# Patient Record
Sex: Female | Born: 1995 | Race: Black or African American | Hispanic: No | Marital: Single | Smoking: Current every day smoker
Health system: Southern US, Community
[De-identification: ages and names within clinical notes are randomized; demographics above are authoritative.]

## PROBLEM LIST (undated history)

## (undated) DIAGNOSIS — E119 Type 2 diabetes mellitus without complications: Secondary | ICD-10-CM

## (undated) DIAGNOSIS — I10 Essential (primary) hypertension: Secondary | ICD-10-CM

## (undated) DIAGNOSIS — L309 Dermatitis, unspecified: Secondary | ICD-10-CM

## (undated) DIAGNOSIS — E669 Obesity, unspecified: Secondary | ICD-10-CM

## (undated) HISTORY — DX: Obesity, unspecified: E66.9

## (undated) HISTORY — DX: Dermatitis, unspecified: L30.9

## (undated) HISTORY — DX: Type 2 diabetes mellitus without complications: E11.9

---
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
## 2017-03-15 IMAGING — CR DG CHEST 2V
2 series · 2 of 2 positions shown · non-contrast
Comparison: None.

CLINICAL DATA: Chest pain. Severe anxiety. Hyperventilated.
Left-sided chest pain. History of hypertension and diabetes.
Nonsmoker.

EXAM:
CHEST  2 VIEW

[w chest lat]
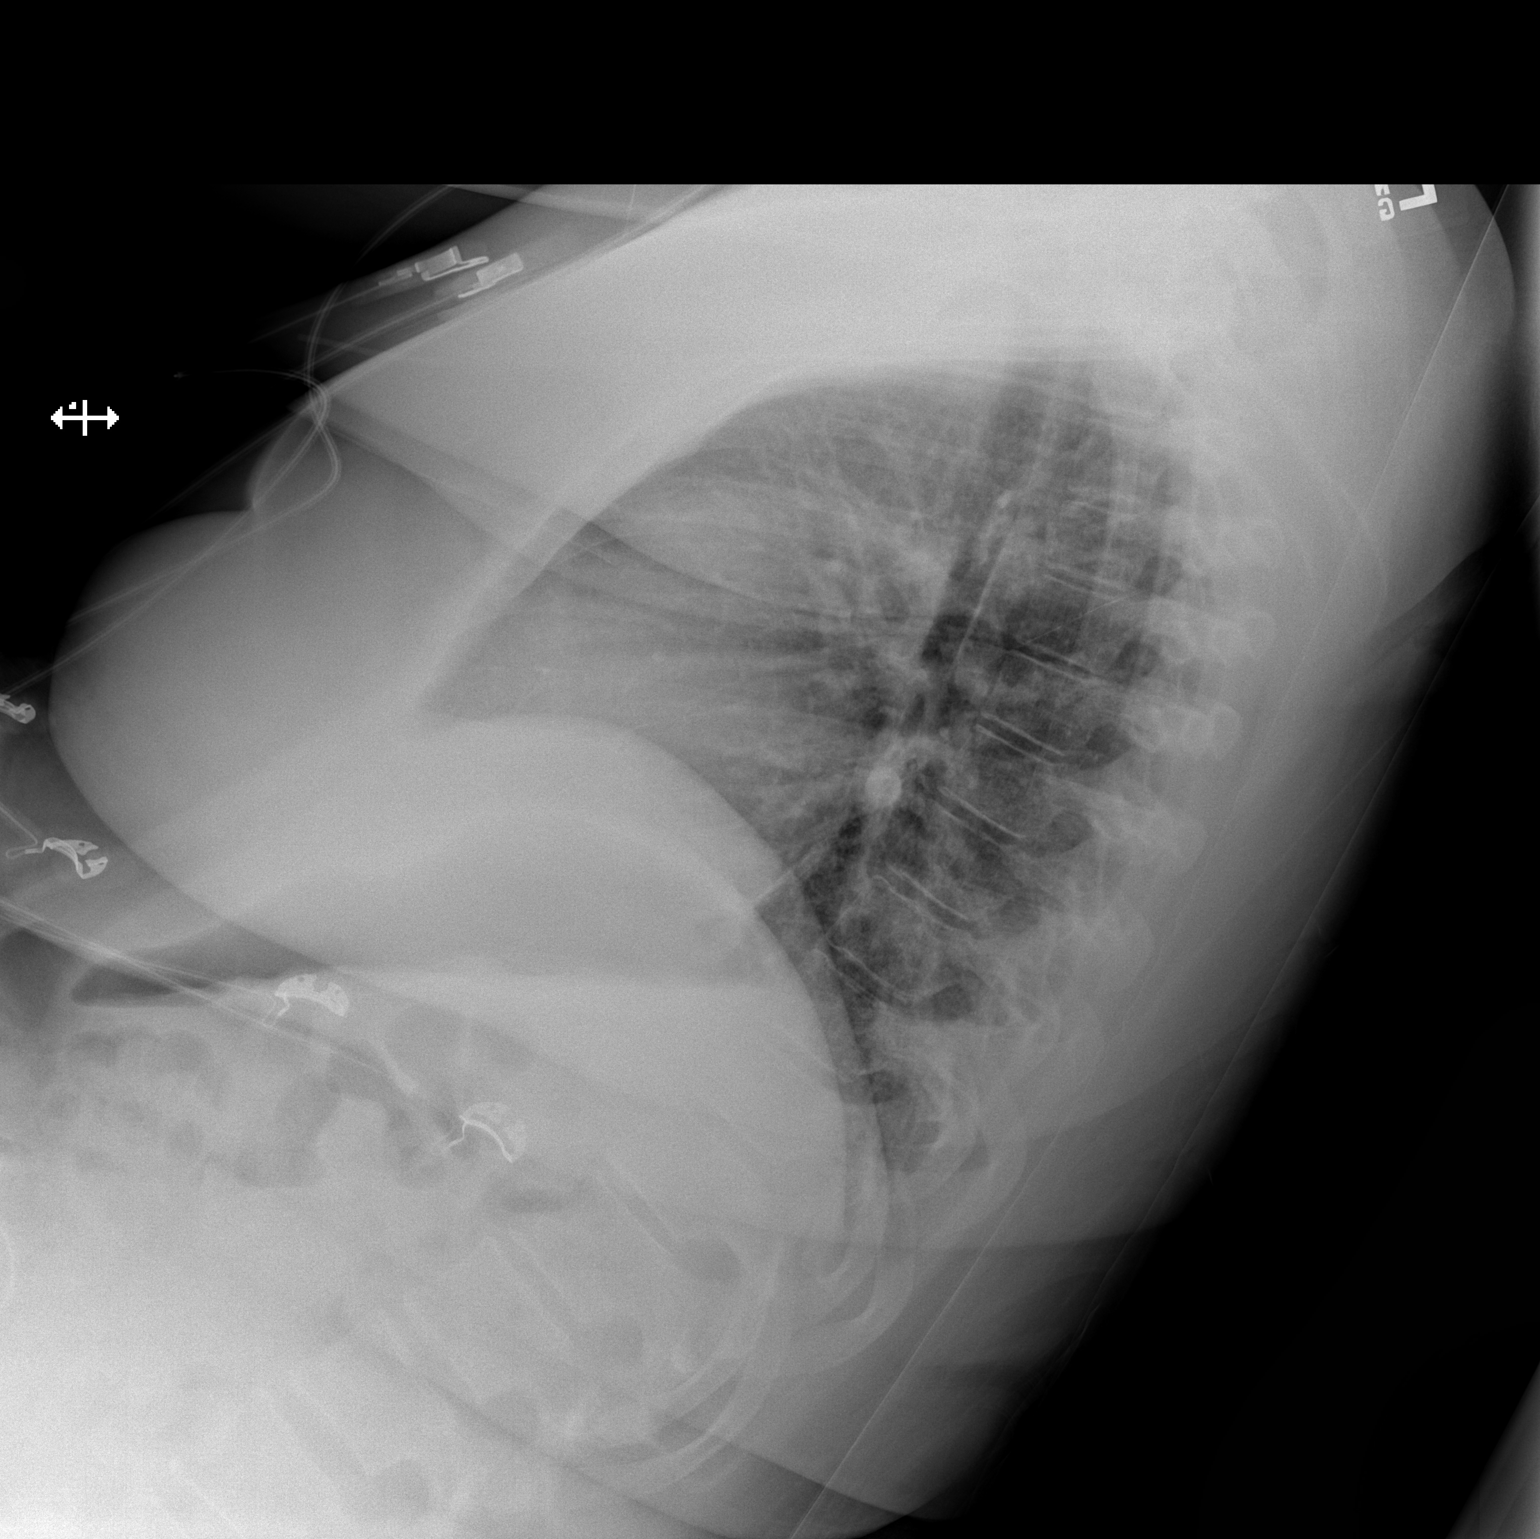

[x chest ap]
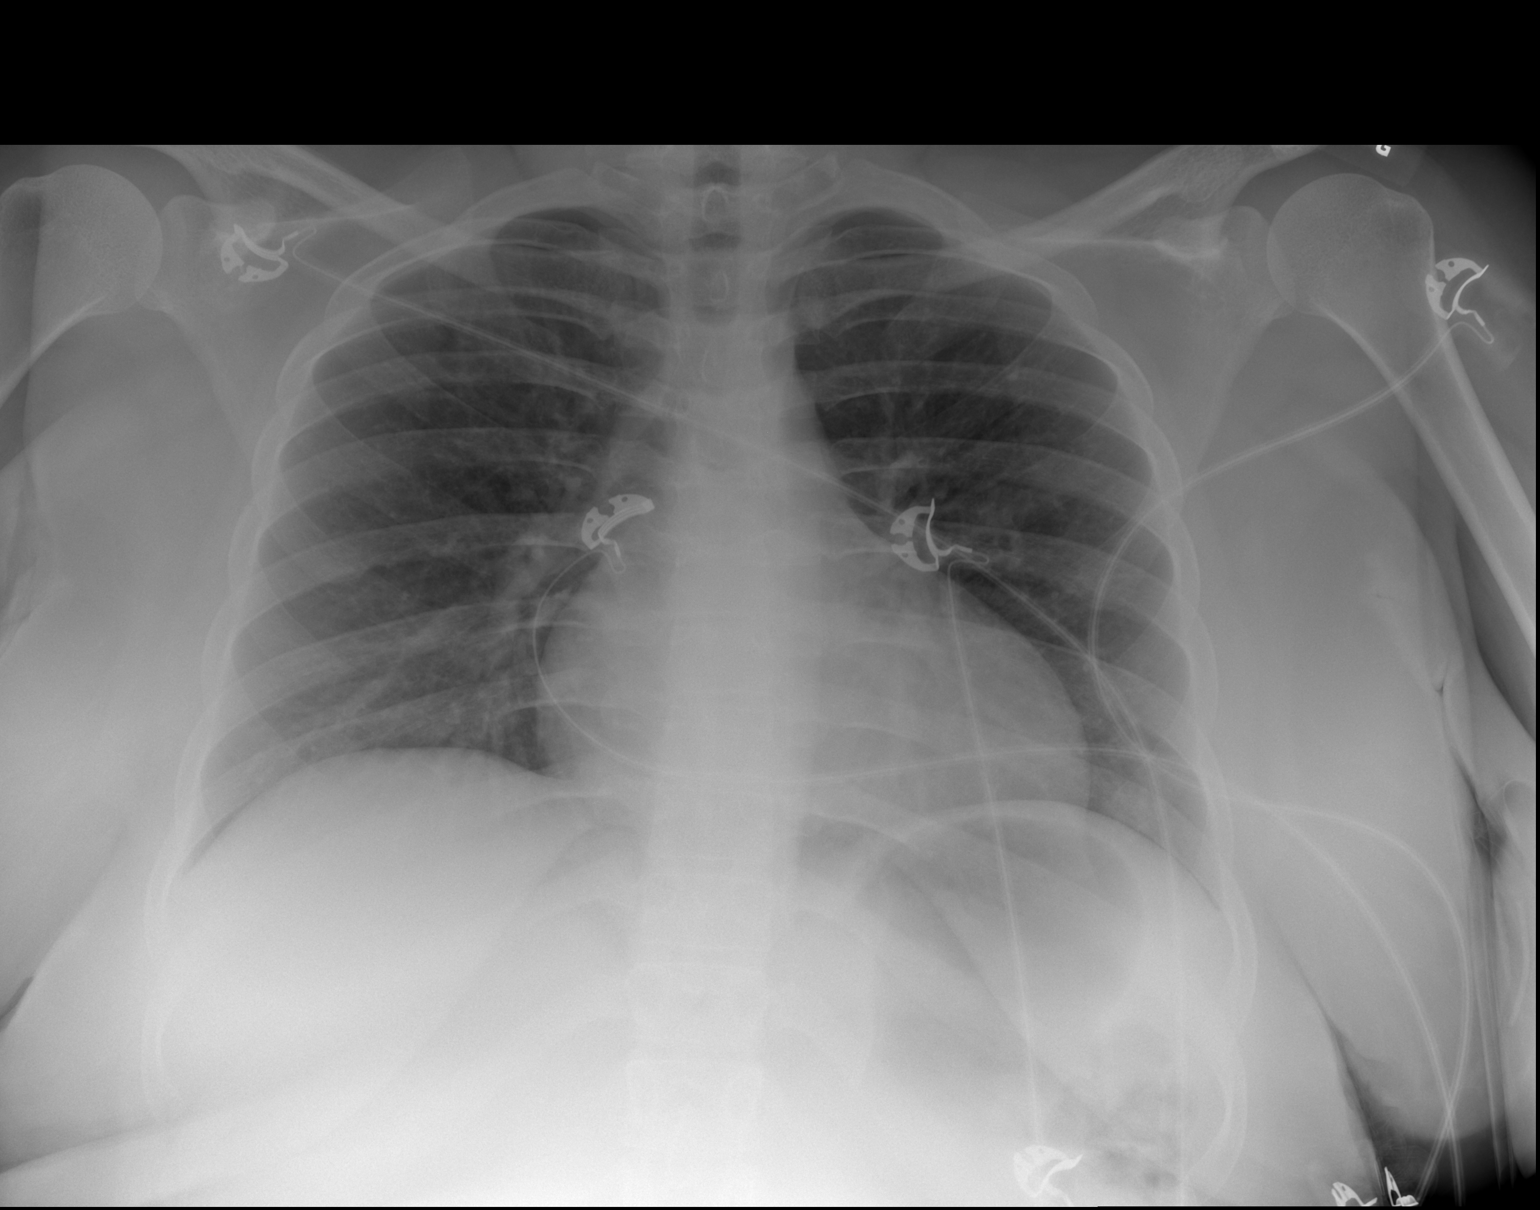

[2 of 2 positions shown; findings below may reference images not displayed]

FINDINGS: The heart size and mediastinal contours are within normal limits.
Both lungs are clear. The visualized skeletal structures are
unremarkable.
IMPRESSION: No active cardiopulmonary disease.

## 2020-09-15 ENCOUNTER — Other Ambulatory Visit: Payer: Self-pay

## 2020-09-15 ENCOUNTER — Encounter (HOSPITAL_COMMUNITY): Payer: Self-pay | Admitting: Emergency Medicine

## 2020-09-15 ENCOUNTER — Emergency Department (HOSPITAL_COMMUNITY)
Admission: EM | Admit: 2020-09-15 | Discharge: 2020-09-15 | Disposition: A | Discharge status: Home / Self Care | Payer: Self-pay | Attending: Emergency Medicine | Admitting: Emergency Medicine

## 2020-09-15 DIAGNOSIS — S39012A Strain of muscle, fascia and tendon of lower back, initial encounter: Secondary | ICD-10-CM | POA: Insufficient documentation

## 2020-09-15 DIAGNOSIS — I10 Essential (primary) hypertension: Secondary | ICD-10-CM | POA: Insufficient documentation

## 2020-09-15 DIAGNOSIS — Z7984 Long term (current) use of oral hypoglycemic drugs: Secondary | ICD-10-CM | POA: Insufficient documentation

## 2020-09-15 DIAGNOSIS — X58XXXA Exposure to other specified factors, initial encounter: Secondary | ICD-10-CM | POA: Insufficient documentation

## 2020-09-15 DIAGNOSIS — Z794 Long term (current) use of insulin: Secondary | ICD-10-CM | POA: Insufficient documentation

## 2020-09-15 DIAGNOSIS — E1140 Type 2 diabetes mellitus with diabetic neuropathy, unspecified: Secondary | ICD-10-CM | POA: Insufficient documentation

## 2020-09-15 DIAGNOSIS — Z79899 Other long term (current) drug therapy: Secondary | ICD-10-CM | POA: Insufficient documentation

## 2020-09-15 DIAGNOSIS — Z7722 Contact with and (suspected) exposure to environmental tobacco smoke (acute) (chronic): Secondary | ICD-10-CM | POA: Insufficient documentation

## 2020-09-15 MED ORDER — METHOCARBAMOL 500 MG PO TABS: 500.0000 mg | ORAL_TABLET | Freq: Two times a day (BID) | ORAL | 0 refills | Status: AC

## 2020-09-15 MED ORDER — METHOCARBAMOL 500 MG PO TABS
500.0000 mg | ORAL_TABLET | Freq: Once | ORAL | Status: AC
  Administered 2020-09-15: 500 mg via ORAL
  Filled 2020-09-15: qty 1

## 2020-09-15 MED ORDER — ACETAMINOPHEN 325 MG PO TABS
650.0000 mg | ORAL_TABLET | Freq: Once | ORAL | Status: AC
  Administered 2020-09-15: 650 mg via ORAL
  Filled 2020-09-15: qty 2

## 2020-09-15 MED ORDER — LIDOCAINE 5 % EX PTCH
1.0000 | MEDICATED_PATCH | CUTANEOUS | Status: DC
  Administered 2020-09-15: 1 via TRANSDERMAL
  Filled 2020-09-15: qty 1

## 2020-09-15 MED ORDER — IBUPROFEN 400 MG PO TABS
600.0000 mg | ORAL_TABLET | Freq: Once | ORAL | Status: AC
  Administered 2020-09-15: 600 mg via ORAL
  Filled 2020-09-15: qty 1

## 2020-09-15 NOTE — Discharge Instructions (Signed)
Take medications along with Tylenol and Motrin to help with your pain. Follow-up with your primary care provider or the 1 listed below. Return to the ER if you start to experience worsening back pain, numbness in your arms or legs, losing control of your bowels or bladder, chest pain or shortness of breath.

## 2020-09-15 NOTE — ED Triage Notes (Signed)
Pt arrives to ED with c/o lower back pain that started this morning. Pt states shooting pains to both calves.No issues with urination and no loss of sensation to lower extremities.

## 2020-09-15 NOTE — ED Provider Notes (Signed)
[facility] EMERGENCY DEPARTMENT Provider Note   CSN: [id number] Arrival date & time: 09/15/20  1135     History Chief Complaint  Patient presents with  . Back Pain    Kim [name] is a 25 y.o. female with a past medical history of hypertension, obesity, diabetes presenting to the ED with a chief complaint of lower back pain.  States this morning when she woke up had an aching pain in her lower back that radiated down both of her legs.  When she got to work she was sitting down her pain worsened.  She took 1 dose of Tylenol this morning with some improvement in her symptoms.  Reports similar back pain in the past.  Denies any loss of bowel or bladder function, dysuria, fever, prior back surgeries, abdominal pain, shortness of breath, injuries or falls.  She denies possibility of pregnancy.  HPI     Past Medical History:  Diagnosis Date  . Diabetes mellitus    Type II, diagnosed in 2009  . Diabetes mellitus   . Diabetes mellitus type II   . Eczema   . Hypertension   . Obesity     Patient Active Problem List   Diagnosis Date Noted  . Noncompliance 01/09/2016  . Neuropathy due to type 2 diabetes mellitus (HCC) 03/21/2015  . Left knee pain 12/20/2014  . Bilateral low back pain without sciatica 12/20/2014  . Adjustment disorder with mixed anxiety and depressed mood 12/02/2014  . BMI (body mass index), pediatric, greater than or equal to 95% for age 66/29/2016  . Fatigue 07/09/2014  . Maladaptive health behaviors affecting medical condition   . Hypertension 11/04/2011  . Goiter 11/04/2011  . Type 2 diabetes mellitus (HCC) 12/15/2010    History reviewed. No pertinent surgical history.   OB History   No obstetric history on file.     Family History  Problem Relation Age of Onset  . Diabetes Maternal Grandmother   . Vision loss Maternal Grandmother   . Hyperlipidemia Maternal Grandmother   . Hypertension Maternal Grandmother   . Cancer  Maternal Grandmother   . Diabetes Maternal Grandfather   . Diabetes Paternal Grandfather   . Diabetes Mother   . Kidney disease Mother   . Vision loss Mother   . Hypertension Mother   . Diabetes Father   . Vision loss Father   . Hypertension Father   . Diabetes Paternal Grandmother     Social History   Tobacco Use  . Smoking status: Passive Smoke Exposure - Never Smoker  . Smokeless tobacco: Never Used  Substance Use Topics  . Alcohol use: No    Alcohol/week: 0.0 standard drinks  . Drug use: No    Home Medications Prior to Admission medications   Medication Sig Start Date End Date Taking? Authorizing Provider  methocarbamol (ROBAXIN) 500 MG tablet Take 1 tablet (500 mg total) by mouth 2 (two) times daily. 09/15/20  Yes Omaira Mellen, PA-C  ACCU-CHEK FASTCLIX LANCETS MISC USE AS DIRECTED TO CHECK BLOOD SGARS SIX TIMES DAILY 01/22/15   Alfonso Ramus T, FNP  acetone, urine, test strip Check ketones per protocol 01/09/16   Alfonso Ramus T, FNP  Alcohol Swabs (ALCOHOL WIPES) 70 % PADS Use wipes 6x a day to clean skin prior to injecting insulin. 01/09/16   Verneda Skill, FNP  cephALEXin (KEFLEX) 500 MG capsule Take 1 capsule (500 mg total) by mouth 2 (two) times daily. Patient not taking: Reported on 03/21/2016 02/04/16  Marlon Pel, PA-C  clotrimazole (LOTRIMIN) 1 % cream Apply to external genitalia area 2 times daily Patient not taking: Reported on 03/21/2016 02/04/16   Marlon Pel, PA-C  etonogestrel (NEXPLANON) 68 MG IMPL implant 1 each by Subdermal route once. Reported on 01/09/2016 04/01/15   Owens Shark, MD  glucagon 1 MG injection Use for Severe Hypoglycemia . Inject 1 mg intramuscularly if unresponsive, unable to swallow, unconscious and/or has seizure 01/09/16   Alfonso Ramus T, FNP  glucose blood (ACCU-CHEK GUIDE) test strip Use as instructed to check blood sugars up to 10x a day 01/13/16   Alfonso Ramus T, FNP  Insulin Glargine (LANTUS SOLOSTAR) 100 UNIT/ML  Solostar Pen Use up to 50 units daily 03/21/16   Kirichenko, Tatyana, PA-C  Insulin Pen Needle (INSUPEN PEN NEEDLES) 32G X 4 MM MISC BD Pen Needles- brand specific. Inject insulin via insulin pen 6 x daily 01/09/16   Alfonso Ramus T, FNP  metFORMIN (GLUCOPHAGE-XR) 500 MG 24 hr tablet TAKE 3 TABLETS DAILY WITH BREAKFAST 01/09/16   Verneda Skill, FNP  metroNIDAZOLE (FLAGYL) 500 MG tablet Take 1 tablet (500 mg total) by mouth 2 (two) times daily. Patient not taking: Reported on 03/21/2016 02/04/16   Marlon Pel, PA-C  ondansetron (ZOFRAN) 4 MG tablet Take 1 tablet (4 mg total) by mouth every 6 (six) hours. Patient not taking: Reported on 03/21/2016 02/04/16   Marlon Pel, PA-C  Vitamin D, Ergocalciferol, (DRISDOL) 50000 units CAPS capsule TAKE ONE CAPSULE BY MOUTH ONCE A WEEK Patient not taking: Reported on 02/04/2016 01/16/16   Verneda Skill, FNP    Allergies    Patient has no known allergies.  Review of Systems   Review of Systems  Constitutional: Negative for chills and fever.  Genitourinary: Negative for dysuria and flank pain.  Musculoskeletal: Positive for back pain and myalgias.  Neurological: Negative for weakness.    Physical Exam Updated Vital Signs BP 126/89 (BP Location: Left Arm)   Pulse 96   Temp 98.4 F (36.9 C) (Oral)   Resp 17   SpO2 98%   Physical Exam Vitals and nursing note reviewed.  Constitutional:      General: She is not in acute distress.    Appearance: She is well-developed and well-nourished. She is not diaphoretic.  HENT:     Head: Normocephalic and atraumatic.  Eyes:     General: No scleral icterus.    Extraocular Movements: EOM normal.     Conjunctiva/sclera: Conjunctivae normal.  Cardiovascular:     Rate and Rhythm: Normal rate and regular rhythm.     Heart sounds: Normal heart sounds.  Pulmonary:     Effort: Pulmonary effort is normal. No respiratory distress.  Musculoskeletal:        General: Tenderness present.     Cervical  back: Normal range of motion.     Lumbar back: Tenderness present.       Back:     Comments: No midline spinal tenderness present in lumbar, thoracic or cervical spine. No step-off palpated. No visible bruising, edema or temperature change noted. No objective signs of numbness present. No saddle anesthesia. 2+ DP pulses bilaterally. Sensation intact to light touch. Strength 5/5 in bilateral lower extremities.  Skin:    Findings: No rash.  Neurological:     Mental Status: She is alert.  Psychiatric:        Mood and Affect: Mood and affect normal.     ED Results / Procedures / Treatments   Labs (  all labs ordered are listed, but only abnormal results are displayed) Labs Reviewed - No data to display  EKG None  Radiology No results found.  Procedures Procedures   Medications Ordered in ED Medications  lidocaine (LIDODERM) 5 % 1 patch (1 patch Transdermal Patch Applied 09/15/20 1412)  methocarbamol (ROBAXIN) tablet 500 mg (500 mg Oral Given 09/15/20 1412)  ibuprofen (ADVIL) tablet 600 mg (600 mg Oral Given 09/15/20 1412)  acetaminophen (TYLENOL) tablet 650 mg (650 mg Oral Given 09/15/20 1412)    ED Course  I have reviewed the triage vital signs and the nursing notes.  Pertinent labs & imaging results that were available during my care of the patient were reviewed by me and considered in my medical decision making (see chart for details).    MDM Rules/Calculators/A&P                          Patient denies any concerning symptoms suggestive of cauda equina requiring urgent imaging at this time such as loss of sensation in the lower extremities, lower extremity weakness, loss of bowel or bladder control, saddle anesthesia, urinary retention, fever/chills, IVDU. Exam demonstrated no  weakness on exam today. No preceding injury or trauma to suggest acute fracture. Doubt pelvic or urinary pathology for patient's acute back pain, as patient denies urinary symptoms, denies possibility of  pregnancy.  Doubt AAA as cause of patient's back pain as patient lacks  major risk factors, had no abdominal TTP, and has symmetric and intact distal pulses.  Based on her physical exam findings suspect that her symptoms are musculoskeletal in nature.  She reports significant improvement with muscle relaxer, anti-inflammatories, Tylenol and lidocaine patch given here.  We will have her continue muscle relaxer, Tylenol and Motrin at home.  Patient given strict return precautions for any symptoms indicating worsening neurologic function in the lower extremities.   Patient is hemodynamically stable, in NAD, and able to ambulate in the ED. Evaluation does not show pathology that would require ongoing emergent intervention or inpatient treatment. I explained the diagnosis to the patient. Pain has been managed and has no complaints prior to discharge. Patient is comfortable with above plan and is stable for discharge at this time. All questions were answered prior to disposition. Strict return precautions for returning to the ED were discussed. Encouraged follow up with PCP.   An After Visit Summary was printed and given to the patient.   Portions of this note were generated with Scientist, clinical (histocompatibility and immunogenetics). Dictation errors may occur despite best attempts at proofreading.  Final Clinical Impression(s) / ED Diagnoses Final diagnoses:  Strain of lumbar region, initial encounter    Rx / DC Orders ED Discharge Orders         Ordered    methocarbamol (ROBAXIN) 500 MG tablet  2 times daily        09/15/20 1456           Dietrich Pates, PA-C 09/15/20 1458    Rozelle Logan, DO 09/15/20 1938

## 2021-02-15 ENCOUNTER — Ambulatory Visit: Payer: Self-pay

## 2022-04-08 ENCOUNTER — Ambulatory Visit: Payer: Medicaid Other | Admitting: Obstetrics and Gynecology

## 2022-06-04 ENCOUNTER — Encounter: Payer: Self-pay | Admitting: Obstetrics and Gynecology

## 2022-06-04 ENCOUNTER — Other Ambulatory Visit (HOSPITAL_COMMUNITY)
Admission: RE | Admit: 2022-06-04 | Discharge: 2022-06-04 | Disposition: A | Discharge status: Home / Self Care | Payer: Medicaid Other | Source: Ambulatory Visit | Attending: Obstetrics and Gynecology | Admitting: Obstetrics and Gynecology

## 2022-06-04 ENCOUNTER — Ambulatory Visit (INDEPENDENT_AMBULATORY_CARE_PROVIDER_SITE_OTHER): Payer: Medicaid Other | Admitting: Obstetrics and Gynecology

## 2022-06-04 VITALS — BP 119/83 | HR 98 | Ht 61.0 in | Wt 200.0 lb

## 2022-06-04 DIAGNOSIS — Z6837 Body mass index (BMI) 37.0-37.9, adult: Secondary | ICD-10-CM

## 2022-06-04 DIAGNOSIS — Z113 Encounter for screening for infections with a predominantly sexual mode of transmission: Secondary | ICD-10-CM

## 2022-06-04 DIAGNOSIS — Z01419 Encounter for gynecological examination (general) (routine) without abnormal findings: Secondary | ICD-10-CM

## 2022-06-04 DIAGNOSIS — Z3046 Encounter for surveillance of implantable subdermal contraceptive: Secondary | ICD-10-CM

## 2022-06-04 DIAGNOSIS — L989 Disorder of the skin and subcutaneous tissue, unspecified: Secondary | ICD-10-CM

## 2022-06-04 DIAGNOSIS — Z01411 Encounter for gynecological examination (general) (routine) with abnormal findings: Secondary | ICD-10-CM

## 2022-06-04 NOTE — Progress Notes (Signed)
     GYNECOLOGY OFFICE PROCEDURE NOTE  Kim [name] is a 25 y.o. G0P0000 here for Nexplanon removal.Pap smear done today.  No other gynecologic concerns.   Nexplanon Removal Patient identified, informed consent performed, consent signed.   Appropriate time out taken. Nexplanon site identified.  Area prepped in usual sterile fashon. One ml of 1% lidocaine was used to anesthetize the area at the distal end of the implant. A small stab incision was made right beside the implant on the distal portion.  The Nexplanon rod was grasped using hemostats and removed without difficulty.  There was minimal blood loss. There were no complications.  3 ml of 1% lidocaine was injected around the incision for post-procedure analgesia.  Steri-strips were applied over the small incision.  A pressure bandage was applied to reduce any bruising.  The patient tolerated the procedure well and was given post procedure instructions.  Patient is planning to use nothing for contraception.      Mariel Aloe, MD, FACOG Obstetrician & Gynecologist, [facility] for [facility], [facility]

## 2022-06-04 NOTE — Progress Notes (Signed)
26 y.o NEW GYN presents for AEX/PAP/Nexplanon removal/STD screening.

## 2022-06-04 NOTE — Progress Notes (Signed)
GYNECOLOGY ANNUAL PREVENTATIVE CARE ENCOUNTER NOTE  History:     Kim [name] is a 26 y.o. G0P0000 female here for a routine annual gynecologic exam.  Current complaints: desires nexplanon removal, irregular menses.   Denies discharge, pelvic pain, problems with intercourse or other gynecologic concerns. Pt notes occasional irregular menses.  She desires nexplanon removed.  She knows it has been present for greater than three years.  Pt says she is "prediabetic" but does not check her blood sugars and has not had a recent A1c   Gynecologic History Patient's last menstrual period was 05/26/2022 (exact date). Contraception: none Last WLS:LHTDS Last mammogram:n/a  Obstetric History OB History  Gravida Para Term Preterm AB Living  0 0 0 0 0 0  SAB IAB Ectopic Multiple Live Births  0 0 0 0 0    Past Medical History:  Diagnosis Date   Diabetes mellitus    Type II, diagnosed in 2009   Diabetes mellitus    Diabetes mellitus type II    Eczema    Hypertension    Obesity     History reviewed. No pertinent surgical history.  Current Outpatient Medications on File Prior to Visit  Medication Sig Dispense Refill   ACCU-CHEK FASTCLIX LANCETS MISC USE AS DIRECTED TO CHECK BLOOD SGARS SIX TIMES DAILY (Patient not taking: Reported on 06/04/2022) 510 each 0   acetone, urine, test strip Check ketones per protocol (Patient not taking: Reported on 06/04/2022) 50 each 3   Alcohol Swabs (ALCOHOL WIPES) 70 % PADS Use wipes 6x a day to clean skin prior to injecting insulin. (Patient not taking: Reported on 06/04/2022) 200 each 6   cephALEXin (KEFLEX) 500 MG capsule Take 1 capsule (500 mg total) by mouth 2 (two) times daily. (Patient not taking: Reported on 03/21/2016) 14 capsule 0   clotrimazole (LOTRIMIN) 1 % cream Apply to external genitalia area 2 times daily (Patient not taking: Reported on 03/21/2016) 15 g 0   etonogestrel (NEXPLANON) 68 MG IMPL implant 1 each by Subdermal route once.  Reported on 01/09/2016 (Patient not taking: Reported on 06/04/2022) 1 each 0   glucagon 1 MG injection Use for Severe Hypoglycemia . Inject 1 mg intramuscularly if unresponsive, unable to swallow, unconscious and/or has seizure (Patient not taking: Reported on 06/04/2022) 1 each 2   glucose blood (ACCU-CHEK GUIDE) test strip Use as instructed to check blood sugars up to 10x a day (Patient not taking: Reported on 06/04/2022) 300 each 12   Insulin Glargine (LANTUS SOLOSTAR) 100 UNIT/ML Solostar Pen Use up to 50 units daily (Patient not taking: Reported on 06/04/2022) 5 pen 6   Insulin Pen Needle (INSUPEN PEN NEEDLES) 32G X 4 MM MISC BD Pen Needles- brand specific. Inject insulin via insulin pen 6 x daily (Patient not taking: Reported on 06/04/2022) 200 each 3   metFORMIN (GLUCOPHAGE-XR) 500 MG 24 hr tablet TAKE 3 TABLETS DAILY WITH BREAKFAST (Patient not taking: Reported on 06/04/2022) 270 tablet 6   methocarbamol (ROBAXIN) 500 MG tablet Take 1 tablet (500 mg total) by mouth 2 (two) times daily. (Patient not taking: Reported on 06/04/2022) 20 tablet 0   metroNIDAZOLE (FLAGYL) 500 MG tablet Take 1 tablet (500 mg total) by mouth 2 (two) times daily. (Patient not taking: Reported on 03/21/2016) 14 tablet 0   ondansetron (ZOFRAN) 4 MG tablet Take 1 tablet (4 mg total) by mouth every 6 (six) hours. (Patient not taking: Reported on 03/21/2016) 12 tablet 0   Vitamin D, Ergocalciferol, (DRISDOL)  50000 units CAPS capsule TAKE ONE CAPSULE BY MOUTH ONCE A WEEK (Patient not taking: No sig reported) 12 capsule 0   No current facility-administered medications on file prior to visit.    No Known Allergies  Social History:  reports that she has been smoking cigarettes. She has been exposed to tobacco smoke. She uses smokeless tobacco. She reports that she does not drink alcohol and does not use drugs.  Family History  Problem Relation Age of Onset   Diabetes Maternal Grandmother    Vision loss Maternal Grandmother     Hyperlipidemia Maternal Grandmother    Hypertension Maternal Grandmother    Cancer Maternal Grandmother    Diabetes Maternal Grandfather    Diabetes Paternal Grandfather    Diabetes Mother    Kidney disease Mother    Vision loss Mother    Hypertension Mother    Diabetes Father    Vision loss Father    Hypertension Father    Diabetes Paternal Grandmother     The following portions of the patient's history were reviewed and updated as appropriate: allergies, current medications, past family history, past medical history, past social history, past surgical history and problem list.  Review of Systems Pertinent items noted in HPI and remainder of comprehensive ROS otherwise negative.  Physical Exam:  BP 119/83   Pulse 98   Ht 5\' 1"  (1.549 m)   Wt 200 lb (90.7 kg)   LMP 05/26/2022 (Exact Date)   BMI 37.79 kg/m  CONSTITUTIONAL: Well-developed, well-nourished female in no acute distress.  HENT:  Normocephalic, atraumatic, External right and left ear normal. Oropharynx is clear and moist EYES: Conjunctivae and EOM are normal.  NECK: Normal range of motion, supple, no masses.  Normal thyroid.  SKIN: Skin is warm and dry. No rash noted. Not diaphoretic. No erythema. No pallor. MUSCULOSKELETAL: Normal range of motion. No tenderness.  No cyanosis, clubbing, or edema.  2+ distal pulses. NEUROLOGIC: Alert and oriented to person, place, and time. Normal reflexes, muscle tone coordination.  PSYCHIATRIC: Normal mood and affect. Normal behavior. Normal judgment and thought content. CARDIOVASCULAR: Normal heart rate noted, regular rhythm RESPIRATORY: Clear to auscultation bilaterally. Effort and breath sounds normal, no problems with respiration noted. BREASTS: Symmetric in size. No masses, tenderness, skin changes, nipple drainage, or lymphadenopathy bilaterally. Performed in the presence of a chaperone. ABDOMEN: Soft, no distention noted.  No tenderness, rebound or guarding.  PELVIC: Large  bands of hypopigmented skin in the right inguinal region and to a lesser degree the left inguinal region.  Some hypopigmented skin noted just inferior to the vaginal opening; normal appearing vaginal mucosa and cervix.  No abnormal discharge noted.  Pap smear obtained.  Vaginal swab obtained. Normal uterine size, no other palpable masses, no uterine or adnexal tenderness.  Performed in the presence of a chaperone.   Assessment and Plan:    1. Routine screening for STI (sexually transmitted infection) Per pt request  - Hepatitis C Antibody - Hepatitis B Surface AntiGEN - RPR - HIV antibody (with reflex) - Cervicovaginal ancillary only( [place])  2. Women's annual routine gynecological examination Normal annual exam with exception of hypopigmented skin  - Cytology - PAP( [place]) - HgB A1c  3. Nexplanon removal See separate note  4. BMI 37.0-37.9, adult Concern for diabetes or prediabetes due to history Will recheck A1c  5. Skin lesion Lesions suspicious for lichen sclerosis, pt to follow up in 1 month to get skin biopsy for further evaluation  Will follow  up results of pap smear and manage accordingly. Routine preventative health maintenance measures emphasized. Please refer to After Visit Summary for other counseling recommendations.      Mariel Aloe, MD, FACOG Obstetrician & Gynecologist, [facility] for [facility], [facility]

## 2022-06-05 LAB — HEPATITIS B SURFACE ANTIGEN: Hepatitis B Surface Ag: NEGATIVE

## 2022-06-05 LAB — HEPATITIS C ANTIBODY: Hep C Virus Ab: NONREACTIVE

## 2022-06-05 LAB — RPR: RPR Ser Ql: NONREACTIVE

## 2022-06-05 LAB — HIV ANTIBODY (ROUTINE TESTING W REFLEX): HIV Screen 4th Generation wRfx: NONREACTIVE

## 2022-06-07 LAB — CERVICOVAGINAL ANCILLARY ONLY
Bacterial Vaginitis (gardnerella): POSITIVE — AB
Candida Glabrata: POSITIVE — AB
Candida Vaginitis: POSITIVE — AB
Chlamydia: NEGATIVE
Comment: NEGATIVE
Comment: NEGATIVE
Comment: NEGATIVE
Comment: NEGATIVE
Comment: NEGATIVE
Comment: NORMAL
Neisseria Gonorrhea: NEGATIVE
Trichomonas: NEGATIVE

## 2022-06-07 LAB — CYTOLOGY - PAP: Diagnosis: NEGATIVE

## 2022-06-08 ENCOUNTER — Other Ambulatory Visit: Payer: Self-pay | Admitting: *Deleted

## 2022-06-08 DIAGNOSIS — E1165 Type 2 diabetes mellitus with hyperglycemia: Secondary | ICD-10-CM

## 2022-06-08 LAB — HEMOGLOBIN A1C
Est. average glucose Bld gHb Est-mCnc: >398 mg/dL
Hgb A1c MFr Bld: >15.5 % — ABNORMAL HIGH (ref 4.8–5.6)

## 2022-06-08 NOTE — Progress Notes (Signed)
TC. No answer. VM not available. MyChart message sent with DX and education. Pt strongly advised to establish care with PCP ASAP and advised of potential health risks of uncontrolled DM. Referral to PCP placed. Pt also given info on finding PCP on [email or web address].

## 2022-06-22 ENCOUNTER — Other Ambulatory Visit: Payer: Self-pay | Admitting: *Deleted

## 2022-06-22 DIAGNOSIS — B3731 Acute candidiasis of vulva and vagina: Secondary | ICD-10-CM

## 2022-06-22 DIAGNOSIS — B9689 Other specified bacterial agents as the cause of diseases classified elsewhere: Secondary | ICD-10-CM

## 2022-06-22 MED ORDER — FLUCONAZOLE 200 MG PO TABS: 200.0000 mg | ORAL_TABLET | Freq: Every day | ORAL | 0 refills | Status: AC

## 2022-06-22 MED ORDER — METRONIDAZOLE 500 MG PO TABS: 500.0000 mg | ORAL_TABLET | Freq: Two times a day (BID) | ORAL | 0 refills | Status: AC

## 2022-06-22 MED ORDER — BORIC ACID CRYS: 600.0000 mg | CRYSTALS | Freq: Every day | 2 refills | Status: AC

## 2022-06-22 NOTE — Progress Notes (Signed)
RX Flagyl, Diflucan, and boric acid for BV, C Vag, and C Glabrata. MyChart message reiterating need for care ASAP for HgbA1C=15. Referral already placed.

## 2022-07-12 ENCOUNTER — Other Ambulatory Visit: Payer: Medicaid Other | Admitting: Obstetrics and Gynecology

## 2023-10-05 ENCOUNTER — Other Ambulatory Visit: Payer: Self-pay | Admitting: *Deleted

## 2023-10-05 ENCOUNTER — Encounter: Payer: Self-pay | Admitting: *Deleted

## 2023-10-07 ENCOUNTER — Encounter

## 2023-10-24 DIAGNOSIS — O0990 Supervision of high risk pregnancy, unspecified, unspecified trimester: Secondary | ICD-10-CM | POA: Insufficient documentation

## 2023-10-25 ENCOUNTER — Encounter: Admitting: Obstetrics and Gynecology

## 2023-10-25 DIAGNOSIS — O0990 Supervision of high risk pregnancy, unspecified, unspecified trimester: Secondary | ICD-10-CM
